# Patient Record
Sex: Female | Born: 1960 | Race: White | Hispanic: No | Marital: Married | Smoking: Former smoker
Health system: Southern US, Community
[De-identification: ages and names within clinical notes are randomized; demographics above are authoritative.]

## PROBLEM LIST (undated history)

## (undated) DIAGNOSIS — N200 Calculus of kidney: Secondary | ICD-10-CM

## (undated) DIAGNOSIS — R030 Elevated blood-pressure reading, without diagnosis of hypertension: Secondary | ICD-10-CM

## (undated) DIAGNOSIS — F32A Depression, unspecified: Secondary | ICD-10-CM

## (undated) DIAGNOSIS — Z87442 Personal history of urinary calculi: Secondary | ICD-10-CM

## (undated) DIAGNOSIS — K219 Gastro-esophageal reflux disease without esophagitis: Secondary | ICD-10-CM

## (undated) DIAGNOSIS — Z973 Presence of spectacles and contact lenses: Secondary | ICD-10-CM

## (undated) DIAGNOSIS — N201 Calculus of ureter: Secondary | ICD-10-CM

## (undated) DIAGNOSIS — F329 Major depressive disorder, single episode, unspecified: Secondary | ICD-10-CM

## (undated) HISTORY — PX: LUMBAR SPINE SURGERY: SHX701

---

## 1988-07-15 HISTORY — PX: ABDOMINAL HYSTERECTOMY: SHX81

## 2005-07-15 HISTORY — PX: OTHER SURGICAL HISTORY: SHX169

## 2007-07-16 HISTORY — PX: WRIST GANGLION EXCISION: SUR520

## 2008-08-28 ENCOUNTER — Emergency Department (HOSPITAL_BASED_OUTPATIENT_CLINIC_OR_DEPARTMENT_OTHER): Admission: EM | Admit: 2008-08-28 | Discharge: 2008-08-28 | Payer: Self-pay | Admitting: Emergency Medicine

## 2008-08-28 ENCOUNTER — Ambulatory Visit: Payer: Self-pay | Admitting: Diagnostic Radiology

## 2008-09-06 ENCOUNTER — Ambulatory Visit (HOSPITAL_BASED_OUTPATIENT_CLINIC_OR_DEPARTMENT_OTHER): Admission: RE | Admit: 2008-09-06 | Discharge: 2008-09-06 | Payer: Self-pay | Admitting: Urology

## 2008-09-06 HISTORY — PX: OTHER SURGICAL HISTORY: SHX169

## 2008-10-17 ENCOUNTER — Ambulatory Visit (HOSPITAL_COMMUNITY): Admission: RE | Admit: 2008-10-17 | Discharge: 2008-10-17 | Payer: Self-pay | Admitting: Urology

## 2008-10-17 HISTORY — PX: OTHER SURGICAL HISTORY: SHX169

## 2010-10-30 LAB — DIFFERENTIAL
Basophils Absolute: 0.7 10*3/uL — ABNORMAL HIGH (ref 0.0–0.1)
Basophils Relative: 4 % — ABNORMAL HIGH (ref 0–1)
Neutro Abs: 13 10*3/uL — ABNORMAL HIGH (ref 1.7–7.7)
Neutrophils Relative %: 79 % — ABNORMAL HIGH (ref 43–77)

## 2010-10-30 LAB — URINALYSIS, ROUTINE W REFLEX MICROSCOPIC
Bilirubin Urine: NEGATIVE
Nitrite: POSITIVE — AB
Specific Gravity, Urine: 1.009 (ref 1.005–1.030)
Urobilinogen, UA: 0.2 mg/dL (ref 0.0–1.0)

## 2010-10-30 LAB — BASIC METABOLIC PANEL
CO2: 26 mEq/L (ref 19–32)
Calcium: 9.4 mg/dL (ref 8.4–10.5)
Creatinine, Ser: 1 mg/dL (ref 0.4–1.2)
Glucose, Bld: 100 mg/dL — ABNORMAL HIGH (ref 70–99)

## 2010-10-30 LAB — POCT HEMOGLOBIN-HEMACUE: Hemoglobin: 13.9 g/dL (ref 12.0–15.0)

## 2010-10-30 LAB — CBC
MCHC: 34.1 g/dL (ref 30.0–36.0)
Platelets: 242 10*3/uL (ref 150–400)
RDW: 12.3 % (ref 11.5–15.5)

## 2010-10-30 LAB — URINE CULTURE

## 2010-10-30 LAB — URINE MICROSCOPIC-ADD ON

## 2010-11-27 NOTE — Op Note (Signed)
Caroline [name], Caroline [name]                  ACCOUNT NO.:  [id number]   MEDICAL RECORD NO.:  [id number]          PATIENT TYPE:  AMB   LOCATION:  [facility]                         FACILITY:  [facility]   PHYSICIAN:  Excell Seltzer. Annabell Howells, M.D.    DATE OF BIRTH:  Feb 28, 1961   DATE OF PROCEDURE:  09/06/2008  DATE OF DISCHARGE:                               OPERATIVE REPORT   PROCEDURE:  Cystoscopy, left retrograde pyelogram with interpretation,  left ureteroscopic stone extraction with holmium laser tripsy, and  insertion of left double-J stent.   PREOPERATIVE DIAGNOSIS:  Multiple left ureteral stones.   POSTOPERATIVE DIAGNOSIS:  Multiple left ureteral stones.   SURGEON:  Excell Seltzer. Annabell Howells, M.D.   ANESTHESIA:  General.   SPECIMENS:  Stone fragments.   DRAINS:  6-French x 26-cm double-J stent.   COMPLICATIONS:  None.   INDICATIONS:  Caroline [name] is a 50 year old white female with multiple left mid  ureteral stones with obstruction and has elected ureteroscopy.   FINDINGS AND PROCEDURE:  The patient was taken to the operating room  where general anesthetic was induced.  She was given a B and O  suppository.  She was given Cipro.  She was placed in lithotomy  position.  Her perineum and genitalia were prepped with Betadine  solution.  She was draped in the usual sterile fashion.  Cystoscopy was  performed using the 22-French scope and 12 and 70-degree lenses.  Examination revealed a normal urethra.  The bladder wall had mild  trabeculation with changes consistent with chronic follicular cystitis.  No tumors or stones were noted.  Ureteral orifices were unremarkable.   The left ureteral orifice was cannulated with a 5-French open-end  catheter and contrast instilled.   Left retrograde pyelogram demonstrated a normal distal ureter, but in  the mid ureter there were three 5 to 6-mm stones stacked on top of one  another with some dilation proximal.   After completion of retrograde pyelography, a wire was placed to  the  kidney alongside the stones and a 12-French dilator introducer cannula  was passed to dilate the distal ureter.   The 6-French short ureteroscope was then inserted alongside the wire.  The most distal stone was visualized and was engaged with the 365 laser  fiber at 0.6 and 6.  The power was increased to 0.8 with a frequency of  8 to aid fragmentation, and the distal most stone was fragmented  readily.  The fragments were then removed with a nitinol basket.  I then  moved to the proximal 2 stones which were then fragmented with the  laser, and once again removed with a nitinol basket.  Once all  significant fragments were removed, the ureteroscope was backed out.  The bladder was evacuated free of some stone fragments.  The cystoscope  was reinserted over the wire and a 6-French 26-cm double-J stent without  a string was passed over the wire to the kidney under fluoroscopic  guidance.  The wire was removed, leaving a good coil in the kidney and a  good coil in the bladder.  Final fluoroscopy  revealed at most a single  small fragment alongside the proximal limb of the stent that was no  larger in diameter than the stent, and it was felt that it should pass  when the stent was removed.   The patient's bladder was drained.  She was taken down from lithotomy  position.  Her anesthetic was reversed.  She was moved to the recovery  room in stable condition.  There were no complications.      Excell Seltzer. Annabell Howells, M.D.  Electronically Signed     JJW/MEDQ  D:  09/06/2008  T:  09/07/2008  Job:  [id number]

## 2010-11-27 NOTE — Op Note (Signed)
Caroline [name], Caroline [name]                  ACCOUNT NO.:  [id number]   MEDICAL RECORD NO.:  [id number]          PATIENT TYPE:  AMB   LOCATION:  DAY                          FACILITY:  [facility]   PHYSICIAN:  Excell Seltzer. Annabell Howells, M.D.    DATE OF BIRTH:  April 10, 1961   DATE OF PROCEDURE:  10/17/2008  DATE OF DISCHARGE:                               OPERATIVE REPORT   PROCEDURES:  Cystoscopy, left retrograde pyelogram, left ureteroscopic  stone extraction, insertion of left double-J stent.   PREOPERATIVE DIAGNOSIS:  Left mid ureteral stone.   POSTOPERATIVE DIAGNOSIS:  Left mid ureteral stone.   SURGEON:  Dr. Bjorn Pippin.   ANESTHESIA:  General.   SPECIMEN:  Stone.   DRAIN:  A 6-French 26-cm double-J stent.   COMPLICATIONS:  None.   INDICATIONS:  Caroline [name] is a 50 year old white female with a history of  stones, who underwent removal of three left mid ureteral stones back in  February.  Since the procedure, she has had some recurrent issues with  pain.  A repeat CT scan today revealed a 5-mm stone in the mid ureter at  the location of the prior stones, but this appears to have been a stone  that was in the kidney prior to her previous treatment.  It was felt  that ureteroscopy was indicated because of the duration of her symptoms.   FINDINGS AND PROCEDURE:  The patient was taken to the operating room  where she received Cipro, a general anesthetic was induced.  She was  placed in lithotomy position.  Her perineum and genitalia were prepped  with Betadine solution.  She was draped in the usual sterile fashion.  A  timeout was performed.   The 6-French short ureteroscope was then passed per urethra.  The left  ureteral orifice was identified and was successfully cannulated and  advanced to the stone at the level of the iliacs.  The stone was engaged  in a [employer] and was quite friable and crumbled.  Eventually, I  was able to negotiate the stone in the basket in a sufficient  orientation for  removal.  With removal of the primary stone fragment,  additional bits of gravel and fragments flushed from the ureter.   At this point, the cystoscope was inserted and a guidewire was passed  through the kidney.  A 6-French flexible ureteroscope was passed over  the guidewire to the kidney and the guidewire was removed.  Contrast was  instilled per the ureteroscopy port.   The left retrograde pyelogram demonstrated blunting of the collecting  system.  Obvious filling defects were not identified, but on CT she did  have some stone in the lower pole of the kidney.   The ureteroscope was then passed into each of the caliceal complexes  without evidence of large residual stones.  A small amount of gravel was  noted that seemed to flush around easily.   At this point, the guidewire was reinserted to the kidney.  The  ureteroscope was removed under direct vision.  The cystoscope was  reinserted over the wire and  a 6-French 26-cm double-J stent with string  was passed without difficulty to the kidney.  The wire was removed  leaving a good coil in the kidney and a good coil in the bladder.  The  bladder was drained of urine and residual fragments and the stent string  was left exiting the urethra and was secured to the patient's inner  thigh.  Her anesthetic was reversed.  She was removed from the recovery  room in stable condition.  There were no complications.      Excell Seltzer. Annabell Howells, M.D.  Electronically Signed     JJW/MEDQ  D:  10/17/2008  T:  10/17/2008  Job:  [id number]

## 2014-01-03 ENCOUNTER — Other Ambulatory Visit: Payer: Self-pay | Admitting: Urology

## 2014-01-20 ENCOUNTER — Encounter (HOSPITAL_BASED_OUTPATIENT_CLINIC_OR_DEPARTMENT_OTHER): Payer: Self-pay | Admitting: *Deleted

## 2014-01-21 ENCOUNTER — Encounter (HOSPITAL_BASED_OUTPATIENT_CLINIC_OR_DEPARTMENT_OTHER): Payer: Self-pay | Admitting: *Deleted

## 2014-01-21 NOTE — Progress Notes (Signed)
NPO AFTER MN. ARRIVE AT [id number]. NEEDS ISTAT 8.

## 2014-01-26 ENCOUNTER — Encounter (HOSPITAL_BASED_OUTPATIENT_CLINIC_OR_DEPARTMENT_OTHER)
Admission: RE | Disposition: A | Discharge status: Home / Self Care | Payer: Self-pay | Source: Ambulatory Visit | Attending: Urology

## 2014-01-26 ENCOUNTER — Ambulatory Visit (HOSPITAL_BASED_OUTPATIENT_CLINIC_OR_DEPARTMENT_OTHER): Payer: BC Managed Care – PPO | Admitting: Anesthesiology

## 2014-01-26 ENCOUNTER — Encounter (HOSPITAL_BASED_OUTPATIENT_CLINIC_OR_DEPARTMENT_OTHER): Payer: BC Managed Care – PPO | Admitting: Anesthesiology

## 2014-01-26 ENCOUNTER — Ambulatory Visit (HOSPITAL_BASED_OUTPATIENT_CLINIC_OR_DEPARTMENT_OTHER)
Admission: RE | Admit: 2014-01-26 | Discharge: 2014-01-26 | Disposition: A | Discharge status: Home / Self Care | Payer: BC Managed Care – PPO | Source: Ambulatory Visit | Attending: Urology | Admitting: Urology

## 2014-01-26 ENCOUNTER — Encounter (HOSPITAL_BASED_OUTPATIENT_CLINIC_OR_DEPARTMENT_OTHER): Payer: Self-pay | Admitting: Anesthesiology

## 2014-01-26 DIAGNOSIS — F329 Major depressive disorder, single episode, unspecified: Secondary | ICD-10-CM | POA: Insufficient documentation

## 2014-01-26 DIAGNOSIS — N200 Calculus of kidney: Secondary | ICD-10-CM | POA: Insufficient documentation

## 2014-01-26 DIAGNOSIS — F3289 Other specified depressive episodes: Secondary | ICD-10-CM | POA: Insufficient documentation

## 2014-01-26 DIAGNOSIS — K219 Gastro-esophageal reflux disease without esophagitis: Secondary | ICD-10-CM | POA: Insufficient documentation

## 2014-01-26 DIAGNOSIS — Z87891 Personal history of nicotine dependence: Secondary | ICD-10-CM | POA: Insufficient documentation

## 2014-01-26 DIAGNOSIS — N201 Calculus of ureter: Secondary | ICD-10-CM | POA: Insufficient documentation

## 2014-01-26 HISTORY — DX: Elevated blood-pressure reading, without diagnosis of hypertension: R03.0

## 2014-01-26 HISTORY — DX: Major depressive disorder, single episode, unspecified: F32.9

## 2014-01-26 HISTORY — PX: CYSTOSCOPY WITH RETROGRADE PYELOGRAM, URETEROSCOPY AND STENT PLACEMENT: SHX5789

## 2014-01-26 HISTORY — DX: Depression, unspecified: F32.A

## 2014-01-26 HISTORY — DX: Gastro-esophageal reflux disease without esophagitis: K21.9

## 2014-01-26 HISTORY — DX: Presence of spectacles and contact lenses: Z97.3

## 2014-01-26 HISTORY — DX: Personal history of urinary calculi: Z87.442

## 2014-01-26 HISTORY — DX: Calculus of ureter: N20.1

## 2014-01-26 HISTORY — PX: HOLMIUM LASER APPLICATION: SHX5852

## 2014-01-26 HISTORY — DX: Calculus of kidney: N20.0

## 2014-01-26 LAB — POCT I-STAT, CHEM 8
BUN: 16 mg/dL (ref 6–23)
CALCIUM ION: 1.3 mmol/L — AB (ref 1.12–1.23)
CREATININE: 0.7 mg/dL (ref 0.50–1.10)
Chloride: 102 mEq/L (ref 96–112)
GLUCOSE: 102 mg/dL — AB (ref 70–99)
HCT: 42 % (ref 36.0–46.0)
HEMOGLOBIN: 14.3 g/dL (ref 12.0–15.0)
Potassium: 3.9 mEq/L (ref 3.7–5.3)
Sodium: 144 mEq/L (ref 137–147)
TCO2: 25 mmol/L (ref 0–100)

## 2014-01-26 SURGERY — CYSTOURETEROSCOPY, WITH RETROGRADE PYELOGRAM AND STENT INSERTION
Anesthesia: General | Site: Ureter | Laterality: Bilateral

## 2014-01-26 MED ORDER — HYDROMORPHONE HCL 2 MG PO TABS
ORAL_TABLET | ORAL | Status: AC
  Filled 2014-01-26: qty 1

## 2014-01-26 MED ORDER — LACTATED RINGERS IV SOLN
INTRAVENOUS | Status: DC
  Administered 2014-01-26: 09:00:00 via INTRAVENOUS
  Filled 2014-01-26: qty 1000

## 2014-01-26 MED ORDER — FENTANYL CITRATE 0.05 MG/ML IJ SOLN
INTRAMUSCULAR | Status: DC | PRN
  Administered 2014-01-26: 25 ug via INTRAVENOUS
  Administered 2014-01-26: 50 ug via INTRAVENOUS
  Administered 2014-01-26: 25 ug via INTRAVENOUS

## 2014-01-26 MED ORDER — FENTANYL CITRATE 0.05 MG/ML IJ SOLN
INTRAMUSCULAR | Status: AC
  Filled 2014-01-26: qty 2

## 2014-01-26 MED ORDER — SULFAMETHOXAZOLE-TMP DS 800-160 MG PO TABS: 1.0000 | ORAL_TABLET | Freq: Two times a day (BID) | ORAL | Status: DC

## 2014-01-26 MED ORDER — STERILE WATER FOR IRRIGATION IR SOLN
Status: DC | PRN
  Administered 2014-01-26: 1000 mL

## 2014-01-26 MED ORDER — FENTANYL CITRATE 0.05 MG/ML IJ SOLN
25.0000 ug | INTRAMUSCULAR | Status: DC | PRN
  Administered 2014-01-26: 50 ug via INTRAVENOUS
  Filled 2014-01-26: qty 1

## 2014-01-26 MED ORDER — GENTAMICIN IN SALINE 1.6-0.9 MG/ML-% IV SOLN
80.0000 mg | INTRAVENOUS | Status: DC
  Filled 2014-01-26: qty 50

## 2014-01-26 MED ORDER — IOHEXOL 350 MG/ML SOLN
INTRAVENOUS | Status: DC | PRN
  Administered 2014-01-26: 30 mL via URETHRAL

## 2014-01-26 MED ORDER — ACETAMINOPHEN 10 MG/ML IV SOLN
INTRAVENOUS | Status: DC | PRN
  Administered 2014-01-26: 1000 mg via INTRAVENOUS

## 2014-01-26 MED ORDER — LACTATED RINGERS IV SOLN
INTRAVENOUS | Status: DC
  Filled 2014-01-26: qty 1000

## 2014-01-26 MED ORDER — SODIUM CHLORIDE 0.9 % IR SOLN
Status: DC | PRN
  Administered 2014-01-26: 6000 mL

## 2014-01-26 MED ORDER — LACTATED RINGERS IV SOLN
INTRAVENOUS | Status: DC | PRN
  Administered 2014-01-26 (×2): via INTRAVENOUS

## 2014-01-26 MED ORDER — PROPOFOL 10 MG/ML IV BOLUS
INTRAVENOUS | Status: DC | PRN
  Administered 2014-01-26: 150 mg via INTRAVENOUS
  Administered 2014-01-26: 50 mg via INTRAVENOUS

## 2014-01-26 MED ORDER — HYDROMORPHONE HCL 2 MG PO TABS
2.0000 mg | ORAL_TABLET | Freq: Four times a day (QID) | ORAL | Status: DC | PRN
  Administered 2014-01-26: 2 mg via ORAL
  Filled 2014-01-26: qty 1

## 2014-01-26 MED ORDER — DEXAMETHASONE SODIUM PHOSPHATE 4 MG/ML IJ SOLN
INTRAMUSCULAR | Status: DC | PRN
  Administered 2014-01-26: 10 mg via INTRAVENOUS

## 2014-01-26 MED ORDER — SENNOSIDES-DOCUSATE SODIUM 8.6-50 MG PO TABS: 1.0000 | ORAL_TABLET | Freq: Two times a day (BID) | ORAL | Status: DC

## 2014-01-26 MED ORDER — ONDANSETRON HCL 4 MG/2ML IJ SOLN
INTRAMUSCULAR | Status: DC | PRN
  Administered 2014-01-26: 4 mg via INTRAVENOUS

## 2014-01-26 MED ORDER — OXYBUTYNIN CHLORIDE 5 MG PO TABS: 5.0000 mg | ORAL_TABLET | Freq: Three times a day (TID) | ORAL | Status: DC | PRN

## 2014-01-26 MED ORDER — HYDROMORPHONE HCL 2 MG PO TABS: 2.0000 mg | ORAL_TABLET | ORAL | Status: AC | PRN

## 2014-01-26 MED ORDER — MIDAZOLAM HCL 5 MG/5ML IJ SOLN
INTRAMUSCULAR | Status: DC | PRN
  Administered 2014-01-26: 2 mg via INTRAVENOUS

## 2014-01-26 MED ORDER — MIDAZOLAM HCL 2 MG/2ML IJ SOLN
INTRAMUSCULAR | Status: AC
Start: 2014-01-26 — End: 2014-01-26
  Filled 2014-01-26: qty 2

## 2014-01-26 MED ORDER — OXYBUTYNIN CHLORIDE 5 MG PO TABS
5.0000 mg | ORAL_TABLET | Freq: Three times a day (TID) | ORAL | Status: AC
  Administered 2014-01-26: 5 mg via ORAL
  Filled 2014-01-26: qty 1

## 2014-01-26 MED ORDER — GENTAMICIN SULFATE 40 MG/ML IJ SOLN
5.0000 mg/kg | Freq: Once | INTRAVENOUS | Status: AC
  Administered 2014-01-26: 370 mg via INTRAVENOUS
  Filled 2014-01-26: qty 9.25

## 2014-01-26 MED ORDER — FENTANYL CITRATE 0.05 MG/ML IJ SOLN
INTRAMUSCULAR | Status: AC
  Filled 2014-01-26: qty 4

## 2014-01-26 MED ORDER — KETOROLAC TROMETHAMINE 30 MG/ML IJ SOLN
INTRAMUSCULAR | Status: DC | PRN
  Administered 2014-01-26: 30 mg via INTRAVENOUS

## 2014-01-26 MED ORDER — LIDOCAINE HCL (CARDIAC) 20 MG/ML IV SOLN
INTRAVENOUS | Status: DC | PRN
  Administered 2014-01-26: 80 mg via INTRAVENOUS

## 2014-01-26 SURGICAL SUPPLY — 40 items
BAG DRAIN URO-CYSTO SKYTR STRL (DRAIN) ×2 IMPLANT
BASKET LASER NITINOL 1.9FR (BASKET) ×2 IMPLANT
BASKET STNLS GEMINI 4WIRE 3FR (BASKET) IMPLANT
BASKET STONE 1.7 NGAGE (UROLOGICAL SUPPLIES) ×2 IMPLANT
BASKET ZERO TIP NITINOL 2.4FR (BASKET) IMPLANT
CANISTER SUCT LVC 12 LTR MEDI- (MISCELLANEOUS) ×2 IMPLANT
CATH INTERMIT  6FR 70CM (CATHETERS) ×2 IMPLANT
CATH URET 5FR 28IN CONE TIP (BALLOONS)
CATH URET 5FR 28IN OPEN ENDED (CATHETERS) IMPLANT
CATH URET 5FR 70CM CONE TIP (BALLOONS) IMPLANT
CLOTH BEACON ORANGE TIMEOUT ST (SAFETY) ×2 IMPLANT
DRAPE CAMERA CLOSED 9X96 (DRAPES) ×2 IMPLANT
ELECT REM PT RETURN 9FT ADLT (ELECTROSURGICAL)
ELECTRODE REM PT RTRN 9FT ADLT (ELECTROSURGICAL) IMPLANT
FIBER LASER FLEXIVA 200 (UROLOGICAL SUPPLIES) ×2 IMPLANT
FIBER LASER FLEXIVA 365 (UROLOGICAL SUPPLIES) IMPLANT
GLOVE BIO SURGEON STRL SZ7.5 (GLOVE) ×2 IMPLANT
GLOVE BIOGEL M 6.5 STRL (GLOVE) ×4 IMPLANT
GLOVE BIOGEL PI IND STRL 7.5 (GLOVE) ×1 IMPLANT
GLOVE BIOGEL PI INDICATOR 7.5 (GLOVE) ×1
GOWN PREVENTION PLUS LG XLONG (DISPOSABLE) IMPLANT
GOWN STRL REIN XL XLG (GOWN DISPOSABLE) IMPLANT
GOWN STRL REUS W/TWL LRG LVL3 (GOWN DISPOSABLE) ×2 IMPLANT
GOWN STRL REUS W/TWL XL LVL3 (GOWN DISPOSABLE) ×2 IMPLANT
GUIDEWIRE 0.038 PTFE COATED (WIRE) IMPLANT
GUIDEWIRE ANG ZIPWIRE 038X150 (WIRE) ×2 IMPLANT
GUIDEWIRE STR DUAL SENSOR (WIRE) ×2 IMPLANT
IV NS IRRIG 3000ML ARTHROMATIC (IV SOLUTION) ×4 IMPLANT
KIT BALLIN UROMAX 15FX10 (LABEL) IMPLANT
KIT BALLN UROMAX 15FX4 (MISCELLANEOUS) IMPLANT
KIT BALLN UROMAX 26 75X4 (MISCELLANEOUS)
PACK CYSTOSCOPY (CUSTOM PROCEDURE TRAY) ×2 IMPLANT
SET HIGH PRES BAL DIL (LABEL)
SHEATH ACCESS URETERAL 24CM (SHEATH) ×2 IMPLANT
SHEATH URET ACCESS 12FR/35CM (UROLOGICAL SUPPLIES) IMPLANT
SHEATH URET ACCESS 12FR/55CM (UROLOGICAL SUPPLIES) IMPLANT
STENT POLARIS 5FRX22 (STENTS) ×4 IMPLANT
SYRINGE 10CC LL (SYRINGE) ×2 IMPLANT
SYRINGE IRR TOOMEY STRL 70CC (SYRINGE) IMPLANT
TUBE FEEDING 8FR 16IN STR KANG (MISCELLANEOUS) ×2 IMPLANT

## 2014-01-26 NOTE — H&P (Signed)
Caroline [name] is an 53 y.o. female.    Chief Complaint: Pre-OP Bilateral Ureteroscopic Stone Manipulation  HPI:   1 - Recurrent Nephrolithiasis -  Pre 2015 - URS x 2 10/2012 - CT - Lt 6mm distal ureteral x 2 + Left intrarenal (about 1.3cm total scattered) + Rt intrarenal non-obstructing (8mm total) by CT. NO hydro. Given trial of medical therapy with no progression by KUB 11/2013 and again 12/2013..  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 No CV disease. No strong blood thinners.   Today Caroline [name] is seen to proceed with bilateral ureteroscpic stone manipulation with goal of stone free for her L>R nephrolithiasis.  No interval fevers. Most recent UA without infectious parameters.  Past Medical History  Diagnosis Date  . Left ureteral calculus   . Renal calculus, right   . History of kidney stones   . Depression   . GERD (gastroesophageal reflux disease)   . Borderline hypertension   . Wears glasses     Past Surgical History  Procedure Laterality Date  . Cysto/  left retrograde pyelogram/ left ureteroscopic laser lithotripsy stone extraction/  stent placement  09-06-2008  . Left ureteroscopic stone extraction/ stent placement  10-17-2008  . Abdominal hysterectomy  1990  . Lumbar spine surgery  x2  last one 2004  . Wrist ganglion excision Right 2009  . Removal tumor/ gland left neck  2007    History reviewed. No pertinent family history. Social History:  reports that she quit smoking about 2 years ago. Her smoking use included Cigarettes. She has a 30 pack-year smoking history. She has never used smokeless tobacco. She reports that she does not drink alcohol or use illicit drugs.  Allergies:  Allergies  Allergen Reactions  . Codeine Other (See Comments)    "feels sensation of blood moving thru body"    No  prescriptions prior to admission    No results found for this or any previous visit (from the past 48 hour(s)). No results found.  Review of Systems  Constitutional: Negative.  Negative for fever.  HENT: Negative.   Eyes: Negative.   Respiratory: Negative.   Cardiovascular: Negative.   Gastrointestinal: Negative.  Negative for nausea and vomiting.  Genitourinary: Positive for flank pain.  Musculoskeletal: Negative.   Skin: Negative.   Neurological: Negative.   Endo/Heme/Allergies: Negative.   Psychiatric/Behavioral: Negative.     Height 5\' 5"  (1.651 m), weight 76.204 kg (168 lb). Physical Exam  Constitutional: She is oriented to person, place, and time. She appears well-developed and well-nourished.  HENT:  Head: Normocephalic and atraumatic.  Eyes: Pupils are equal, round, and reactive to light.  Neck: Normal range of motion. Neck supple.  Cardiovascular: Normal rate.   Respiratory: Effort normal and breath sounds normal.  GI: Soft. Bowel sounds are normal.  Genitourinary:  Very mild Lt CVAT  Musculoskeletal: Normal range of motion.  Neurological: She is alert and oriented to person, place, and time.  Skin: Skin is warm and dry.  Psychiatric: She has a normal mood and affect. Her behavior is normal. Judgment and thought content normal.     Assessment/Plan  1 - Recurrent Nephrolithiasis - no interval progression with medical therapy. She wants bilateral ureeroscopic clean out.  We rediscussed ureteroscopic stone manipulation with basketing and laser-lithotripsy in detail.  We rediscussed risks including  bleeding, infection, damage to kidney / ureter  bladder, rarely loss of kidney. We rediscussed anesthetic risks and rare but serious surgical complications including DVT, PE, MI, and mortality. We specifically readdressed that in 5-10% of cases a staged approach is required with stenting followed by re-attempt ureteroscopy if anatomy unfavorable.   The patient voiced  understanding and wises to proceed today as planned.   Reinforced she will need bilateral stents at least temporarily since working on both renal units.   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   Ryleigh Buenger 01/26/2014, 6:09 AM

## 2014-01-26 NOTE — Discharge Instructions (Signed)
1 - You may have urinary urgency (bladder spasms) and bloody urine on / off with stent in place. This is normal. ° °2 - Call MD or go to ER for fever >102, severe pain / nausea / vomiting not relieved by medications, or acute change in medical status ° °Post Anesthesia Home Care Instructions ° °Activity: °Get plenty of rest for the remainder of the day. A responsible adult should stay with you for 24 hours following the procedure.  °For the next 24 hours, DO NOT: °-Drive a car °-Operate machinery °-Drink alcoholic beverages °-Take any medication unless instructed by your physician °-Make any legal decisions or sign important papers. ° °Meals: °Start with liquid foods such as gelatin or soup. Progress to regular foods as tolerated. Avoid greasy, spicy, heavy foods. If nausea and/or vomiting occur, drink only clear liquids until the nausea and/or vomiting subsides. Call your physician if vomiting continues. ° °Special Instructions/Symptoms: °Your throat may feel dry or sore from the anesthesia or the breathing tube placed in your throat during surgery. If this causes discomfort, gargle with warm salt water. The discomfort should disappear within 24 hours. °[facility] °[phone] °Post Ureteroscopy With or Without Stent Instructions ° °Definitions: ° °Ureter: The duct that transports urine from the kidney to the bladder. °Stent:   A plastic hollow tube that is placed into the ureter, from the kidney to the                 bladder to prevent the ureter from swelling shut. ° °GENERAL INSTRUCTIONS: ° °Despite the fact that no skin incisions were used, the area around the ureter and bladder is raw and irritated. The stent is a foreign body which will further irritate the bladder wall. This irritation is manifested by increased frequency of urination, both day and night, and by an increase in the urge to urinate. In some, the urge to urinate is present almost always. Sometimes the urge is strong enough  that you may not be able to stop yourself from urinating. The only real cure is to remove the stent and then give time for the bladder wall to heal which can't be done until the danger of the ureter swelling shut has passed, which varies. ° °You may see some blood in your urine while the stent is in place and a few days afterwards. Do not be alarmed, even if the urine was clear for a while. Get off your feet and drink lots of fluids until clearing occurs. If you start to pass clots or don't improve, call us. ° °DIET: °You may return to your normal diet immediately. Because of the raw surface of your bladder, alcohol, spicy foods, acid type foods and drinks with caffeine may cause irritation or frequency and should be used in moderation. To keep your urine flowing freely and to avoid constipation, drink plenty of fluids during the day ( 8-10 glasses ). °Tip: Avoid cranberry juice because it is very acidic. ° °ACTIVITY: °Your physical activity doesn't need to be restricted. However, if you are very active, you may see some blood in your urine. We suggest that you reduce your activity under these circumstances until the bleeding has stopped. ° °BOWELS: °It is important to keep your bowels regular during the postoperative period. Straining with bowel movements can cause bleeding. A bowel movement every other day is reasonable. Use a mild laxative if needed, such as Milk of Magnesia 2-3 tablespoons, or 2 Dulcolax tablets. Call if you continue to   have problems. If you have been taking narcotics for pain, before, during or after your surgery, you may be constipated. Take a laxative if necessary. ° ° °MEDICATION: °You should resume your pre-surgery medications unless told not to. In addition you will often be given an antibiotic to prevent infection. These should be taken as prescribed until the bottles are finished unless you are having an unusual reaction to one of the drugs. ° °PROBLEMS YOU SHOULD REPORT TO US: °· Fevers  over 100.5 Fahrenheit. °· Heavy bleeding, or clots ( See above notes about blood in urine ). °· Inability to urinate. °· Drug reactions ( hives, rash, nausea, vomiting, diarrhea ). °· Severe burning or pain with urination that is not improving. ° °FOLLOW-UP: °You will need a follow-up appointment to monitor your progress. Call for this appointment at the number listed above. Usually the first appointment will be about three to fourteen days after your surgery. ° ° ° ° ° °

## 2014-01-26 NOTE — Brief Op Note (Signed)
01/26/2014  11:26 AM  PATIENT:  Caroline [name]  53 y.o. female  PRE-OPERATIVE DIAGNOSIS:  LEFT URETERAL AND RIGHT RENAL STONES  POST-OPERATIVE DIAGNOSIS:  LEFT URETERAL AND RIGHT RENAL STONEs  PROCEDURE:  Procedure(s): CYSTOSCOPY WITH RETROGRADE PYELOGRAM, URETEROSCOPY AND STENT PLACEMENT (Bilateral) HOLMIUM LASER APPLICATION (Bilateral)  SURGEON:  Surgeon(s) and Role:    * Sebastian Acheheodore Jakiah Bienaime, MD - Primary  PHYSICIAN ASSISTANT:   ASSISTANTS: none   ANESTHESIA:   general  EBL:  Total I/O In: 700 [I.V.:700] Out: -   BLOOD ADMINISTERED:none  DRAINS: none   LOCAL MEDICATIONS USED:  NONE  SPECIMEN:  Source of Specimen:  Bilateral nephrolithiasis  DISPOSITION OF SPECIMEN:  [facility] for compositional analysis  COUNTS:  YES  TOURNIQUET:  * No tourniquets in log *  DICTATION: .Other Dictation: Dictation Number [id number]  PLAN OF CARE: Discharge to home after PACU  PATIENT DISPOSITION:  PACU - hemodynamically stable.   Delay start of Pharmacological VTE agent (>24hrs) due to surgical blood loss or risk of bleeding: not applicable

## 2014-01-26 NOTE — Anesthesia Postprocedure Evaluation (Signed)
  Anesthesia Post-op Note  Patient: Caroline HoyerDawn [name]  Procedure(s) Performed: Procedure(s) (LRB): CYSTOSCOPY WITH RETROGRADE PYELOGRAM, URETEROSCOPY AND STENT PLACEMENT (Bilateral) HOLMIUM LASER APPLICATION (Bilateral)  Patient Location: PACU  Anesthesia Type: General  Level of Consciousness: awake and alert   Airway and Oxygen Therapy: Patient Spontanous Breathing  Post-op Pain: mild  Post-op Assessment: Post-op Vital signs reviewed, Patient's Cardiovascular Status Stable, Respiratory Function Stable, Patent Airway and No signs of Nausea or vomiting  Last Vitals:  Filed Vitals:   01/26/14 1153  BP:   Pulse: 71  Temp:   Resp: 19    Post-op Vital Signs: stable   Complications: No apparent anesthesia complications

## 2014-01-26 NOTE — OR Nursing (Signed)
Bilateral stone taking by Dr. Berneice HeinrichManny

## 2014-01-26 NOTE — Anesthesia Procedure Notes (Signed)
Procedure Name: LMA Insertion Date/Time: 01/26/2014 9:39 AM Performed by: Tyrone NineSAUVE, Brion Sossamon F Pre-anesthesia Checklist: Patient identified, Timeout performed, Emergency Drugs available, Suction available and Patient being monitored Patient Re-evaluated:Patient Re-evaluated prior to inductionOxygen Delivery Method: Circle system utilized Preoxygenation: Pre-oxygenation with 100% oxygen Intubation Type: IV induction Ventilation: Mask ventilation without difficulty LMA: LMA inserted LMA Size: 4.0 Number of attempts: 1 Airway Equipment and Method: Bite block Placement Confirmation: positive ETCO2 and breath sounds checked- equal and bilateral Tube secured with: Tape Dental Injury: Teeth and Oropharynx as per pre-operative assessment

## 2014-01-26 NOTE — Transfer of Care (Addendum)
Immediate Anesthesia Transfer of Care Note  Patient: [name]  Procedure(s) Performed: Procedure(s) (LRB): CYSTOSCOPY WITH RETROGRADE PYELOGRAM, URETEROSCOPY AND STENT PLACEMENT (Bilateral) HOLMIUM LASER APPLICATION (Bilateral)  Patient Location: PACU  Anesthesia Type: General  Level of Consciousness: awake, alert  and oriented  Airway & Oxygen Therapy: Patient Spontanous Breathing and Patient connected to nasal cannula oxygen  Post-op Assessment: Report given to PACU RN and Post -op Vital signs reviewed and stable  Post vital signs: Reviewed and stable  Complications: No apparent anesthesia complications

## 2014-01-26 NOTE — Anesthesia Preprocedure Evaluation (Addendum)
Anesthesia Evaluation  Patient identified by MRN, date of birth, ID band Patient awake    Reviewed: Allergy & Precautions, H&P , NPO status , Patient's Chart, lab work & pertinent test results  Airway Mallampati: II TM Distance: >3 FB Neck ROM: full    Dental no notable dental hx. (+) Teeth Intact, Dental Advisory Given, Partial Lower   Pulmonary neg pulmonary ROS, former smoker,  30 py former smoker breath sounds clear to auscultation  Pulmonary exam normal       Cardiovascular Exercise Tolerance: Good negative cardio ROS  Rhythm:regular Rate:Normal  Borderline htn   Neuro/Psych Depression Panic disordernegative neurological ROS  negative psych ROS   GI/Hepatic negative GI ROS, Neg liver ROS, GERD-  Medicated and Controlled,  Endo/Other  negative endocrine ROS  Renal/GU Renal diseasenegative Renal ROSRenal calculus  negative genitourinary   Musculoskeletal   Abdominal   Peds  Hematology negative hematology ROS (+)   Anesthesia Other Findings Partial plate is @ home.  Reproductive/Obstetrics negative OB ROS                      Anesthesia Physical Anesthesia Plan  ASA: II  Anesthesia Plan: General   Post-op Pain Management:    Induction: Intravenous  Airway Management Planned: LMA  Additional Equipment:   Intra-op Plan:   Post-operative Plan:   Informed Consent: I have reviewed the patients History and Physical, chart, labs and discussed the procedure including the risks, benefits and alternatives for the proposed anesthesia with the patient or authorized representative who has indicated his/her understanding and acceptance.   Dental Advisory Given  Plan Discussed with: CRNA and Surgeon  Anesthesia Plan Comments:         Anesthesia Quick Evaluation

## 2014-01-27 ENCOUNTER — Encounter (HOSPITAL_BASED_OUTPATIENT_CLINIC_OR_DEPARTMENT_OTHER): Payer: Self-pay | Admitting: Urology

## 2014-01-28 NOTE — Op Note (Signed)
NAMAmaryllis [name]:  [name], DON                   ACCOUNT NO.:  [id number]  MEDICAL RECORD NO.:  [id number]  LOCATION:                                 FACILITY:  PHYSICIAN:  Sebastian Acheheodore Deandria Klute, MD     DATE OF BIRTH:  07/03/1961  DATE OF PROCEDURE:  01/26/2014 DATE OF DISCHARGE:  01/26/2014                              OPERATIVE REPORT   DIAGNOSIS:  Left ureteral and bilateral renal stones.  PROCEDURE: 1. Cystoscopy with bilateral retrograde pyelogram interpretation. 2. Bilateral ureteroscopy with laser lithotripsy. 3. Insertion of bilateral ureteral stents 5 x 22 Polaris, no tether.  ESTIMATED BLOOD LOSS:  Nil.  COMPLICATIONS:  None.  SPECIMEN:  Left ureteral and bilateral renal stones for compositional analysis.  FINDINGS: 1. Left retrograde pyelogram with distal filling defect consistent     with known stone. 2. Unremarkable right retrograde pyelogram. 3. Left ureteral and bilateral renal stones as expected. 4. Excellent deployment of bilateral stents proximal curl, renal     pelvis distal, and urinary bladder.  INDICATION:  Ms. Caroline [name] is a pleasant 53 year old lady with a history recurrent nephrolithiasis.  She has had known left distal ureteral stones for several months and is on a trial of medical therapy, however, she has failed to pass her stones and fragments had been persistent on serial imaging.  Options were discussed for management including continued medical therapy versus shockwave lithotripsy versus ureteroscopy with unilateral versus bilateral treatment, and she wished to proceed with bilateral ureteroscopic stone manipulation for goal of stone free.  Informed consent was obtained and placed in medical record.  PROCEDURE IN DETAIL:  The patient being Caroline [name] verified, procedure being bilateral ureteroscopic stone manipulation was confirmed. Procedure was carried out.  Time-out was performed.  Intravenous antibiotics were administered.  General LMA anesthesia was  introduced. Patient placed into a low lithotomy position.  Sterile field was created by prepping and draping the patient's vagina, introitus, and proximal thighs using iodine x3.  Next, cystourethroscopy was performed using a 22-French rigid cystoscope with 12-degree offset lens.  Inspection of the urinary bladder revealed no diverticula, calcifications, papular lesions.  The left ureteral orifice was cannulated with a 6-French end- hole catheter and left retrograde pyelogram was obtained.  Left retrograde pyelogram demonstrated single left ureter with single system left kidney.  There were 2 filling defects in distal ureter consistent with known stone.  A 0.038, angle-tipped ZIPwire was advanced at the level of the upper pole and set aside as a safety wire.  Next, semi-rigid ureteroscopy was performed of the distal left ureter using a 6-French semi-rigid ureteroscope alongside a separate Sensor working wire with the feeding tube in urinary bladder for pressure release.  As expected, 2 distal stones were encountered.  These appeared to be too large for simple basketing.  As such Holmium laser energy was applied to the stone using a 200 nanometer fiber with settings of 0.5 joules and 5 hertz fragmenting the stone approximately 3-4 pieces.  These were then grasped with an escape basket and brought out in their entirety.  Semi- rigid ureteroscopy, the remaining 2/3 of the proximal ureter revealed no additional stone fragments and no mucosal  abnormalities.  As the goal was stone free on this side, and it was felt that there were likely renal pelvis stones, the semi-rigid ureteroscope was exchanged for a 12/14 ureteral access sheath over the sensor working wire to the level of the proximal ureter using continuous fluoroscopic guidance.  Next, flexible digital ureteroscopy performed using a 6-French tip flexible digital ureteroscope.  Inspection of each calix x2 revealed multifocal renal  stones, most predominantly in the lower pole.  Most of these were amenable to simple basketing and were removed with an escape basket or an engage basket respectively.  In the lower pole, there was a stone within a calyx.  It had relatively narrow infundibulum and Holmium laser energy was applied to the stone, which resulted in fragmentation into many smaller pieces.  The dominant fragment, these were grasped and brought out in their entirety.  There were multiple small pieces still left each of which of these were approximately 1 mm or less in diameter, but conglomerate was felt to be proximal 5 to 6 mm.  As such, a blood mop technique was used in which 5 mL the patient's autologous blood was collected by Anesthesia personnel and injected via the ureteroscope into lower pole calyx, allowed to sit for 5 minutes and then sequentially removed.  Thus removing approximately 75% of this residual stone in the lower pole.  Following these maneuvers, repeat panendoscopic examination of the left kidney revealed complete resolution of all stone fragments larger than 1/3 mm, no evidence of perforation.  The sheath was removed under continuous fluoroscopic vision and no mucosal abnormalities were found.  Finally, a new 5 x 22 Polaris-type stent was placed over the remaining safety wire using fluoroscopic guidance.  Good proximal and distal deployment were noted.  Attention was then directed at the right side.  The right ureteral orifice was cannulated with a 6-French catheter and right retrograde pyelogram obtained.  Right retrograde pyelogram demonstrated a single right ureter, single system right kidney.  No filling defects or narrowing noted.  A 0.038 ZIPwire was advanced at the level of the upper pole and set aside as a safety wire.  Next, semi-rigid ureteroscopy was performed of the distal two-thirds of the right ureter alongside a separate Sensor working wire. No mucosal abnormalities were  found.  The semi-rigid ureteroscope was then exchanged for a 12/14 ureteral access sheath at the level of proximal ureter using continuous fluoroscopic vision.  Next, flexible digital ureteroscopy performed on the right side using 6-French flexible digital ureteroscope.  Panendoscopic examination of each calix x2 on the right side revealed multifocal stones in all aspects of the kidney, most commonly in the lower pole.  Most of these were amenable to simple basketing and an engage basket was used to remove each fragment, set aside for compositional analysis.  In the lower pole, there was a very large dominant submucosal stone, it was larger than the others.  As such, Holmium laser energy was used to unroof, the stone fragmented to approximately 3-4 smaller pieces which were then removed in their entirety.  Repeat panendoscopic examination of left kidney, revealed complete resolution of all stone fragments larger than 1/3 mm, no evidence of perforation.  Hemostasis appeared excellent.  The sheath was removed under continuous ureteroscopic vision.  No mucosal abnormalities were found.  Finally, a new 5 x 22 Polaris-type stent was placed with remaining safety wire and good proximal and distal deployment were noted.  The bladder was emptied per cystoscope and several small fragments  had previously been in the upper tract were irrigated and set aside for compositional analysis.  Procedure was then terminated.  The patient tolerated the procedure well.  There were no immediate periprocedural complications.  The patient was taken to postanesthesia care unit in stable condition.         ______________________________ Sebastian Ache, MD    TM/MEDQ  D:  01/26/2014  T:  01/26/2014  Job:  [id number]

## 2014-05-30 ENCOUNTER — Encounter (HOSPITAL_BASED_OUTPATIENT_CLINIC_OR_DEPARTMENT_OTHER): Payer: Self-pay

## 2014-05-30 ENCOUNTER — Emergency Department (HOSPITAL_BASED_OUTPATIENT_CLINIC_OR_DEPARTMENT_OTHER)
Admission: EM | Admit: 2014-05-30 | Discharge: 2014-05-30 | Disposition: A | Discharge status: Home / Self Care | Payer: BC Managed Care – PPO | Attending: Emergency Medicine | Admitting: Emergency Medicine

## 2014-05-30 ENCOUNTER — Emergency Department (HOSPITAL_BASED_OUTPATIENT_CLINIC_OR_DEPARTMENT_OTHER): Payer: BC Managed Care – PPO

## 2014-05-30 DIAGNOSIS — Z973 Presence of spectacles and contact lenses: Secondary | ICD-10-CM | POA: Insufficient documentation

## 2014-05-30 DIAGNOSIS — R1032 Left lower quadrant pain: Secondary | ICD-10-CM | POA: Insufficient documentation

## 2014-05-30 DIAGNOSIS — Z79899 Other long term (current) drug therapy: Secondary | ICD-10-CM | POA: Insufficient documentation

## 2014-05-30 DIAGNOSIS — Z8719 Personal history of other diseases of the digestive system: Secondary | ICD-10-CM | POA: Diagnosis not present

## 2014-05-30 DIAGNOSIS — R1031 Right lower quadrant pain: Secondary | ICD-10-CM | POA: Diagnosis not present

## 2014-05-30 DIAGNOSIS — Z87442 Personal history of urinary calculi: Secondary | ICD-10-CM | POA: Insufficient documentation

## 2014-05-30 DIAGNOSIS — M549 Dorsalgia, unspecified: Secondary | ICD-10-CM | POA: Diagnosis present

## 2014-05-30 DIAGNOSIS — F329 Major depressive disorder, single episode, unspecified: Secondary | ICD-10-CM | POA: Diagnosis not present

## 2014-05-30 DIAGNOSIS — Z9889 Other specified postprocedural states: Secondary | ICD-10-CM | POA: Insufficient documentation

## 2014-05-30 DIAGNOSIS — R109 Unspecified abdominal pain: Secondary | ICD-10-CM

## 2014-05-30 LAB — URINALYSIS, ROUTINE W REFLEX MICROSCOPIC
Bilirubin Urine: NEGATIVE
Glucose, UA: NEGATIVE mg/dL
Hgb urine dipstick: NEGATIVE
Ketones, ur: NEGATIVE mg/dL
Leukocytes, UA: NEGATIVE
NITRITE: NEGATIVE
PH: 7 (ref 5.0–8.0)
Protein, ur: NEGATIVE mg/dL
SPECIFIC GRAVITY, URINE: 1.012 (ref 1.005–1.030)
Urobilinogen, UA: 1 mg/dL (ref 0.0–1.0)

## 2014-05-30 LAB — CBC WITH DIFFERENTIAL/PLATELET
Basophils Absolute: 0 10*3/uL (ref 0.0–0.1)
Basophils Relative: 0 % (ref 0–1)
Eosinophils Absolute: 0.1 10*3/uL (ref 0.0–0.7)
Eosinophils Relative: 2 % (ref 0–5)
HCT: 42.1 % (ref 36.0–46.0)
Hemoglobin: 13.8 g/dL (ref 12.0–15.0)
Lymphocytes Relative: 37 % (ref 12–46)
Lymphs Abs: 3 10*3/uL (ref 0.7–4.0)
MCH: 28.9 pg (ref 26.0–34.0)
MCHC: 32.8 g/dL (ref 30.0–36.0)
MCV: 88.1 fL (ref 78.0–100.0)
Monocytes Absolute: 0.6 10*3/uL (ref 0.1–1.0)
Monocytes Relative: 7 % (ref 3–12)
Neutro Abs: 4.4 10*3/uL (ref 1.7–7.7)
Neutrophils Relative %: 54 % (ref 43–77)
Platelets: 249 10*3/uL (ref 150–400)
RBC: 4.78 MIL/uL (ref 3.87–5.11)
RDW: 13.8 % (ref 11.5–15.5)
WBC: 8.1 10*3/uL (ref 4.0–10.5)

## 2014-05-30 LAB — BASIC METABOLIC PANEL WITH GFR
Anion gap: 11 (ref 5–15)
BUN: 19 mg/dL (ref 6–23)
CO2: 29 meq/L (ref 19–32)
Calcium: 9.4 mg/dL (ref 8.4–10.5)
Chloride: 101 meq/L (ref 96–112)
Creatinine, Ser: 0.9 mg/dL (ref 0.50–1.10)
GFR calc Af Amer: 83 mL/min — ABNORMAL LOW
GFR calc non Af Amer: 71 mL/min — ABNORMAL LOW
Glucose, Bld: 103 mg/dL — ABNORMAL HIGH (ref 70–99)
Potassium: 3.5 meq/L — ABNORMAL LOW (ref 3.7–5.3)
Sodium: 141 meq/L (ref 137–147)

## 2014-05-30 MED ORDER — SODIUM CHLORIDE 0.9 % IV BOLUS (SEPSIS)
1000.0000 mL | Freq: Once | INTRAVENOUS | Status: AC
Start: 2014-05-30 — End: 2014-05-30
  Administered 2014-05-30: 1000 mL via INTRAVENOUS

## 2014-05-30 MED ORDER — KETOROLAC TROMETHAMINE 30 MG/ML IJ SOLN
30.0000 mg | Freq: Once | INTRAMUSCULAR | Status: AC
  Administered 2014-05-30: 30 mg via INTRAVENOUS
  Filled 2014-05-30: qty 1

## 2014-05-30 MED ORDER — HYDROCODONE-ACETAMINOPHEN 5-325 MG PO TABS: 1.0000 | ORAL_TABLET | Freq: Four times a day (QID) | ORAL | Status: AC | PRN

## 2014-05-30 NOTE — ED Notes (Signed)
MD at bedside. 

## 2014-05-30 NOTE — ED Notes (Signed)
C/o pain to entire back since 11/11-denies injury

## 2014-05-30 NOTE — Discharge Instructions (Signed)
Back Pain, Adult Low back pain is very common. About 1 in 5 people have back pain.The cause of low back pain is rarely dangerous. The pain often gets better over time.About half of people with a sudden onset of back pain feel better in just 2 weeks. About 8 in 10 people feel better by 6 weeks.  CAUSES Some common causes of back pain include:  Strain of the muscles or ligaments supporting the spine.  Wear and tear (degeneration) of the spinal discs.  Arthritis.  Direct injury to the back. DIAGNOSIS Most of the time, the direct cause of low back pain is not known.However, back pain can be treated effectively even when the exact cause of the pain is unknown.Answering your caregiver's questions about your overall health and symptoms is one of the most accurate ways to make sure the cause of your pain is not dangerous. If your caregiver needs more information, he or she may order lab work or imaging tests (X-rays or MRIs).However, even if imaging tests show changes in your back, this usually does not require surgery. HOME CARE INSTRUCTIONS For many people, back pain returns.Since low back pain is rarely dangerous, it is often a condition that people can learn to manageon their own.   Remain active. It is stressful on the back to sit or stand in one place. Do not sit, drive, or stand in one place for more than 30 minutes at a time. Take short walks on level surfaces as soon as pain allows.Try to increase the length of time you walk each day.  Do not stay in bed.Resting more than 1 or 2 days can delay your recovery.  Do not avoid exercise or work.Your body is made to move.It is not dangerous to be active, even though your back may hurt.Your back will likely heal faster if you return to being active before your pain is gone.  Pay attention to your body when you bend and lift. Many people have less discomfortwhen lifting if they bend their knees, keep the load close to their bodies,and  avoid twisting. Often, the most comfortable positions are those that put less stress on your recovering back.  Find a comfortable position to sleep. Use a firm mattress and lie on your side with your knees slightly bent. If you lie on your back, put a pillow under your knees.  Only take over-the-counter or prescription medicines as directed by your caregiver. Over-the-counter medicines to reduce pain and inflammation are often the most helpful.Your caregiver may prescribe muscle relaxant drugs.These medicines help dull your pain so you can more quickly return to your normal activities and healthy exercise.  Put ice on the injured area.  Put ice in a plastic bag.  Place a towel between your skin and the bag.  Leave the ice on for 15-20 minutes, 03-04 times a day for the first 2 to 3 days. After that, ice and heat may be alternated to reduce pain and spasms.  Ask your caregiver about trying back exercises and gentle massage. This may be of some benefit.  Avoid feeling anxious or stressed.Stress increases muscle tension and can worsen back pain.It is important to recognize when you are anxious or stressed and learn ways to manage it.Exercise is a great option. SEEK MEDICAL CARE IF:  You have pain that is not relieved with rest or medicine.  You have pain that does not improve in 1 week.  You have new symptoms.  You are generally not feeling well. SEEK   IMMEDIATE MEDICAL CARE IF:   You have pain that radiates from your back into your legs.  You develop new bowel or bladder control problems.  You have unusual weakness or numbness in your arms or legs.  You develop nausea or vomiting.  You develop abdominal pain.  You feel faint. Document Released: 07/01/2005 Document Revised: 12/31/2011 Document Reviewed: 11/02/2013 Exit[employer] Patient Information 2015 [employer]. This information is not intended to replace advice given to you by your health care provider. Make sure you  discuss any questions you have with your health care provider.  

## 2014-05-30 NOTE — ED Provider Notes (Signed)
CSN: [id number]     Arrival date & time 05/30/14  1659 History  This chart was scribed for Toy CookeyMegan Nishka Heide, MD by Charline BillsEssence Howell, ED Scribe. The patient was seen in room MH11/MH11. Patient's care was started at 6:54 PM.   Chief Complaint  Patient presents with  . Back Pain   Patient is a 53 y.o. female presenting with back pain. The history is provided by the patient. No language interpreter was used.  Back Pain Location:  Generalized Quality:  Burning Pain severity:  Moderate Onset quality:  Sudden Duration:  5 days Timing:  Constant Progression:  Worsening Relieved by:  Lying down Worsened by:  Movement Associated symptoms: abdominal pain   Associated symptoms: no chest pain, no dysuria, no fever, no headaches, no numbness, no pelvic pain and no weakness    HPI Comments: Caroline [name] is a 53 y.o. female who presents to the Emergency Department complaining of constant, gradually worsening L-sided back pain onset 5 days ago. Pt describes the pain as a burning sensation that radiates into lower abdomen. Pain is exacerbated with certain movements and lying on her back. Pain is similar to previous kidney pain. She denies nausea, vomiting, diarrhea, dysuria, vaginal bleeding, vaginal discharge, urinary or bowel incontinence, fever, chills, numbness, weakness. Pt has tried stool softeners without relief. No recent heavy lifting. Pt reports h/o kidney stone surgery; last surgery in July 2015. She also reports h/o lumbar surgeries over 10 years ago.   [facility]rologist: [facility]   Past Medical History  Diagnosis Date  . Left ureteral calculus   . Renal calculus, right   . History of kidney stones   . Depression   . GERD (gastroesophageal reflux disease)   . Borderline hypertension   . Wears glasses    Past Surgical History  Procedure Laterality Date  . Cysto/  left retrograde pyelogram/ left ureteroscopic laser lithotripsy stone extraction/  stent placement  09-06-2008  . Left ureteroscopic  stone extraction/ stent placement  10-17-2008  . Abdominal hysterectomy  1990  . Lumbar spine surgery  x2  last one 2004  . Wrist ganglion excision Right 2009  . Removal tumor/ gland left neck  2007  . Cystoscopy with retrograde pyelogram, ureteroscopy and stent placement Bilateral 01/26/2014    Procedure: CYSTOSCOPY WITH RETROGRADE PYELOGRAM, URETEROSCOPY AND STENT PLACEMENT;  Surgeon: Sebastian Acheheodore Manny, MD;  [facility];  Service: Urology;  Laterality: Bilateral;  . Holmium laser application Bilateral 01/26/2014    Procedure: HOLMIUM LASER APPLICATION;  Surgeon: Sebastian Acheheodore Manny, MD;  [facility];  Service: Urology;  Laterality: Bilateral;   No family history on file. History  Substance Use Topics  . Smoking status: Former Smoker -- 0.00 packs/day for 30 years    Quit date: 01/22/2012  . Smokeless tobacco: Never Used     Comment: CURRENTLY USING E-CIG  . Alcohol Use: No   OB History    No data available     Review of Systems  Constitutional: Negative for fever, chills, diaphoresis, activity change, appetite change and fatigue.  HENT: Negative for congestion, facial swelling, rhinorrhea and sore throat.   Eyes: Negative for photophobia and discharge.  Respiratory: Negative for cough, chest tightness and shortness of breath.   Cardiovascular: Negative for chest pain, palpitations and leg swelling.  Gastrointestinal: Positive for abdominal pain. Negative for nausea, vomiting and diarrhea.  Endocrine: Negative for polydipsia and polyuria.  Genitourinary: Negative for dysuria, frequency, vaginal bleeding, vaginal discharge, difficulty urinating and pelvic pain.  Musculoskeletal: Positive for back pain. Negative for arthralgias, neck pain and neck stiffness.  Skin: Negative for color change and wound.  Allergic/Immunologic: Negative for immunocompromised state.  Neurological: Negative for facial asymmetry, weakness, numbness and headaches.   Hematological: Does not bruise/bleed easily.  Psychiatric/Behavioral: Negative for confusion and agitation.   Allergies  Codeine and Percocet  Home Medications   Prior to Admission medications   Medication Sig Start Date End Date Taking? Authorizing Provider  triamterene-hydrochlorothiazide (DYAZIDE) 37.5-25 MG per capsule Take 1 capsule by mouth daily.   Yes Historical Provider, MD  doxepin (SINEQUAN) 10 MG capsule Take 30 mg by mouth at bedtime.    Historical Provider, MD  HYDROmorphone (DILAUDID) 2 MG tablet Take 1 tablet (2 mg total) by mouth every 4 (four) hours as needed for moderate pain or severe pain. Post-operatively 01/26/14   Sebastian Acheheodore Manny, MD   Triage Vitals: BP 156/88 mmHg  Pulse 79  Temp(Src) 98 F (36.7 C)  Resp 16  Ht 5\' 5"  (1.651 m)  Wt 160 lb (72.576 kg)  BMI 26.63 kg/m2  SpO2 100% Physical Exam  Constitutional: She is oriented to person, place, and time. She appears well-developed and well-nourished. No distress.  HENT:  Head: Normocephalic.  Mouth/Throat: Oropharynx is clear and moist.  Eyes: Pupils are equal, round, and reactive to light.  Neck: Neck supple.  Cardiovascular: Normal rate, regular rhythm and normal heart sounds.   Pulmonary/Chest: Effort normal and breath sounds normal. No respiratory distress. She has no wheezes.  Abdominal: Soft. She exhibits no distension. There is tenderness in the right lower quadrant and left lower quadrant. There is CVA tenderness. There is no rebound and no guarding.  L CVA tenderness Mild bilateral lower quadrant tenderness without rebound or guarding  Musculoskeletal: She exhibits no edema or tenderness.  No lower extremity numbness or weakness Negative straight leg raise  Neurological: She is alert and oriented to person, place, and time.  Skin: Skin is warm and dry.  Psychiatric: She has a normal mood and affect.  Nursing note and vitals reviewed.  ED Course  Procedures (including critical care  time) DIAGNOSTIC STUDIES: Oxygen Saturation is 100% on RA, normal by my interpretation.    COORDINATION OF CARE: 7:03 PM-Discussed treatment plan which includes CT and medication for pain with pt at bedside and pt agreed to plan.   Labs Review Labs Reviewed  BASIC METABOLIC PANEL - Abnormal; Notable for the following:    Potassium 3.5 (*)    Glucose, Bld 103 (*)    GFR calc non Af Amer 71 (*)    GFR calc Af Amer 83 (*)    All other components within normal limits  URINALYSIS, ROUTINE W REFLEX MICROSCOPIC  CBC WITH DIFFERENTIAL   Imaging Review Ct Renal Stone Study  05/30/2014   CLINICAL DATA:  LEFT flank pain for 5 days, history of kidney stones and low back surgery  EXAM: CT ABDOMEN AND PELVIS WITHOUT CONTRAST  TECHNIQUE: Multidetector CT imaging of the abdomen and pelvis was performed following the standard protocol without IV contrast. Sagittal and coronal MPR images reconstructed from axial data set. Oral contrast not administered for this indication.  COMPARISON:  10/26/2013  FINDINGS: Lung bases clear.  Multiple small BILATERAL nonobstructing renal calculi.  No hydronephrosis, ureteral dilatation or ureteral calcification.  Unremarkable bladder.  Liver, spleen, pancreas, and adrenal glands unremarkable for technique.  Tiny umbilical hernia containing fat.  Uterus surgically absent with normal sized ovaries and nonvisualization of appendix.  Stomach and  bowel loops unremarkable for technique.  Scattered atherosclerotic calcifications.  No mass, adenopathy, free fluid, free air, or acute osseous findings.  IMPRESSION: BILATERAL small nonobstructing renal calculi.  Tiny umbilical hernia containing fat.  No acute intra-abdominal or intrapelvic abnormalities.   Electronically Signed   By: Ulyses Southward M.D.   On: 05/30/2014 19:29    EKG Interpretation None      MDM   Final diagnoses:  None    Pt is a 53 y.o. female with Pmhx as above who presents with about 6 days of generalized back  pain which is now worse in her left flank with some radiation to the lower abdomen. Pain is worse with movement. She's had no associated GU symptoms, no fever, chills, nausea vomiting or diarrhea. She's had no numbness, weakness, bowel or bladder incontinence. On physical exam vital signs are stable and she is in no acute distress. She has left sided CVA tenderness. Neuro exam is unremarkable. Given her history of recurrent ureteral stones requiring manipulation, CT stone study was ordered and was normal. UA was also normal.CBC and BMP are grossly unremarkable except for mild hypokalemia of 3.5. She felt somewhat better after IV Toradol. We'll discharge home with Norco for pain. I suspect that she is having musculoskeletal back pain. Have recommended she follow up closely with her PCP office and return for new or worsening symptoms.      I personally performed the services described in this documentation, which was scribed in my presence. The recorded information has been reviewed and is accurate.    Toy Cookey, MD 05/30/14 [phone]

## 2014-05-30 NOTE — ED Notes (Signed)
Patient transported to CT via stretcher per tech. 

## 2014-05-30 NOTE — ED Notes (Signed)
MD at bedside to discuss results of testing. 

## 2015-09-01 IMAGING — CT CT RENAL STONE PROTOCOL
2 of 4 series · 17 of 46 positions shown, 19 images · non-contrast
Comparison: 10/26/2013

CLINICAL DATA: LEFT flank pain for 5 days, history of kidney stones
and low back surgery

EXAM:
CT ABDOMEN AND PELVIS WITHOUT CONTRAST
TECHNIQUE: Multidetector CT imaging of the abdomen and pelvis was performed
following the standard protocol without IV contrast. Sagittal and
coronal MPR images reconstructed from axial data set. Oral contrast
not administered for this indication.

[Series 2: renal stone > 200 lbs 5.0 b31f · axial · 0.79mm/px · z∈[-471,-66]mm · 14 of 89 slices shown, 16 images]
[im 4/89  soft-tissue]
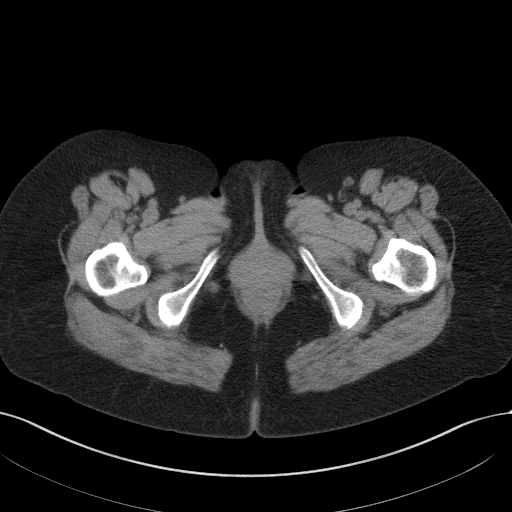
[im 4/89  bone]
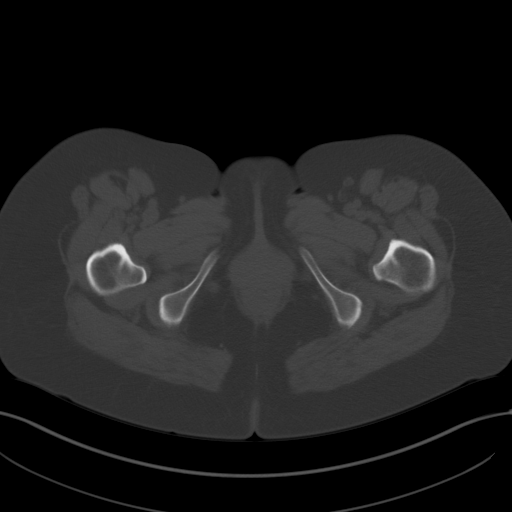
[im 11/89  soft-tissue]
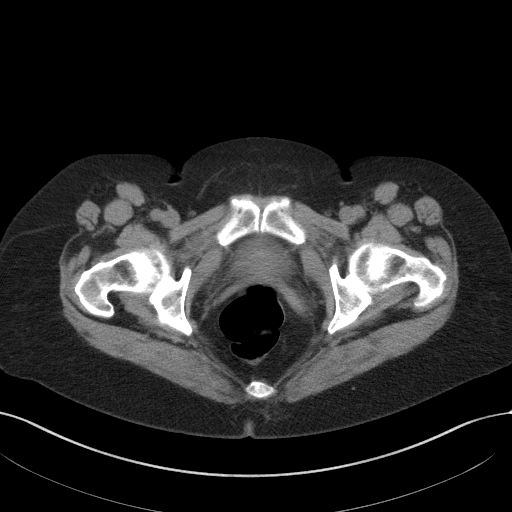
[im 18/89  soft-tissue]
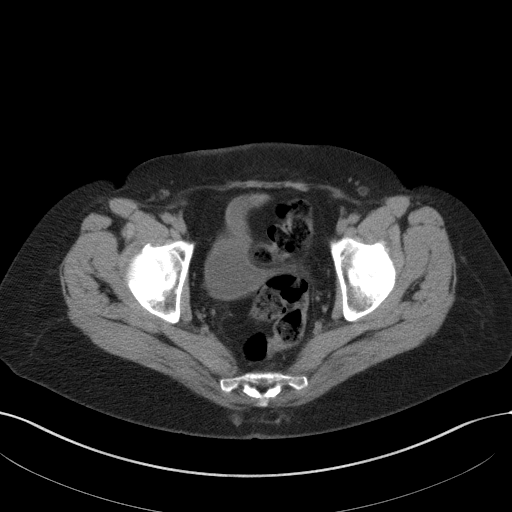
[im 25/89  soft-tissue]
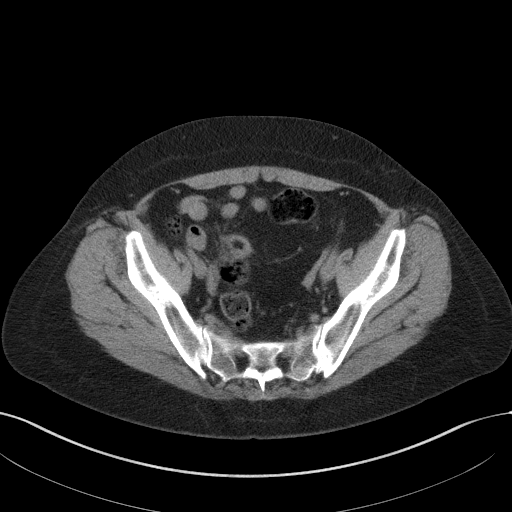
[im 29/89  soft-tissue]
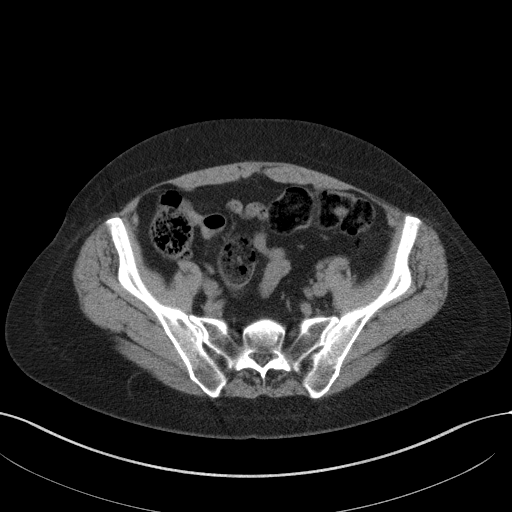
[im 36/89  soft-tissue]
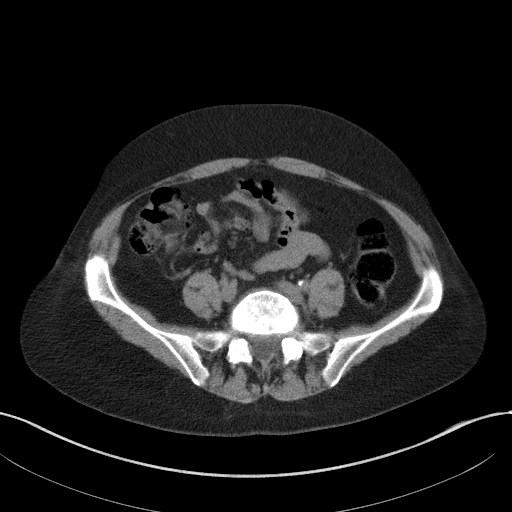
[im 43/89  soft-tissue]
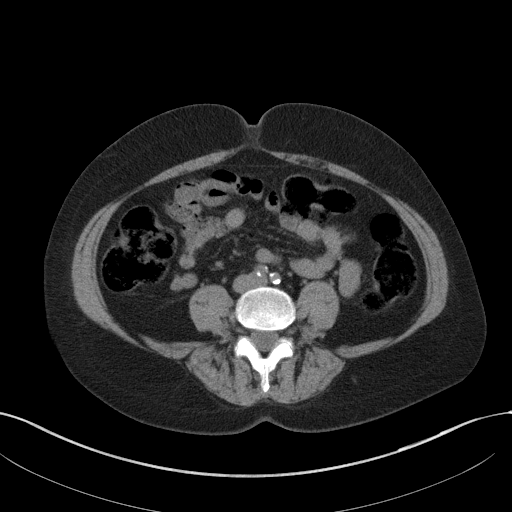
[im 46/89  soft-tissue]
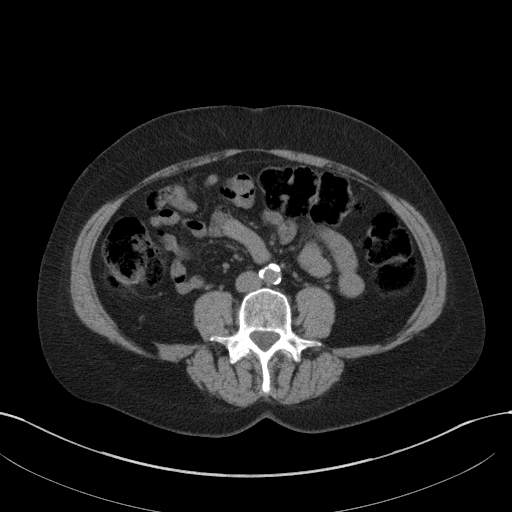
[im 53/89  soft-tissue]
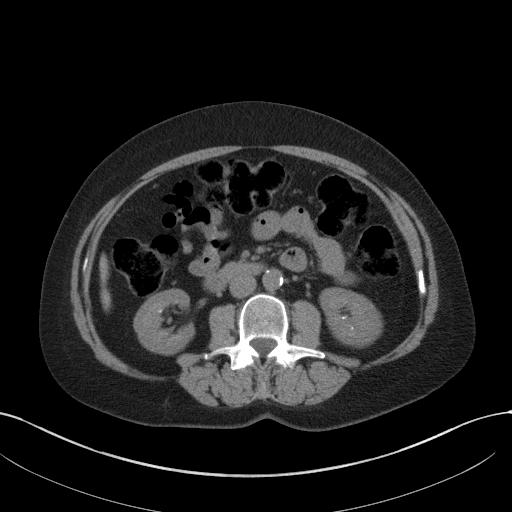
[im 53/89  bone]
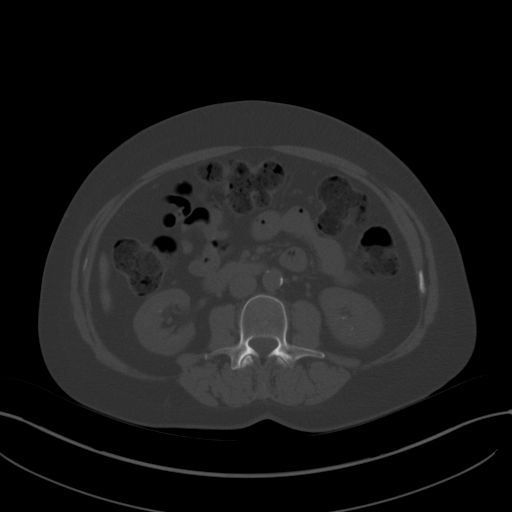
[im 60/89  soft-tissue]
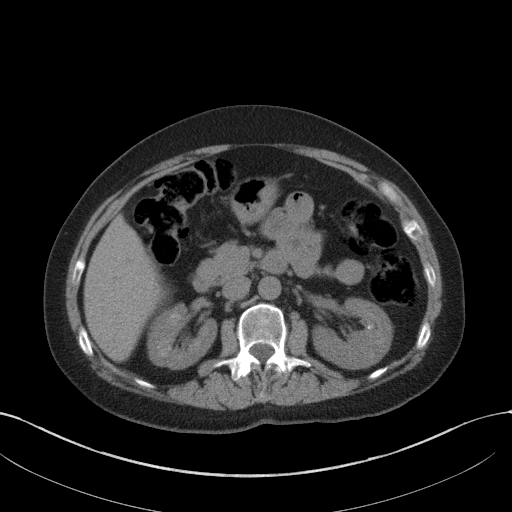
[im 67/89  soft-tissue]
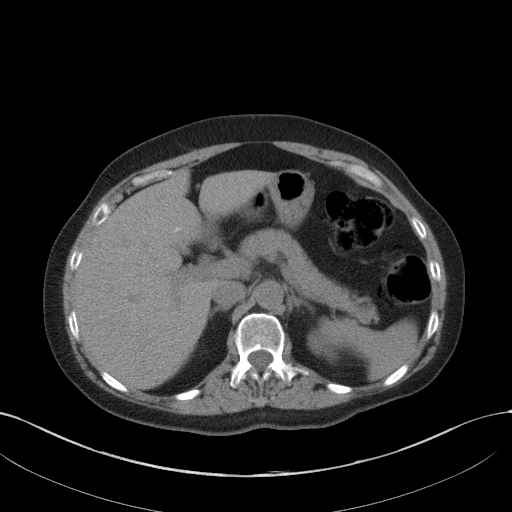
[im 71/89  soft-tissue]
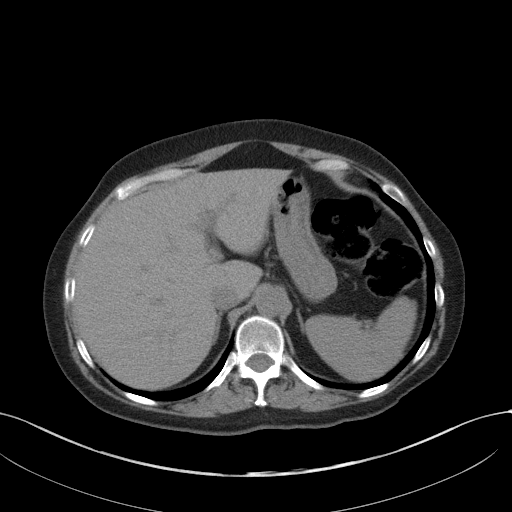
[im 78/89  soft-tissue]
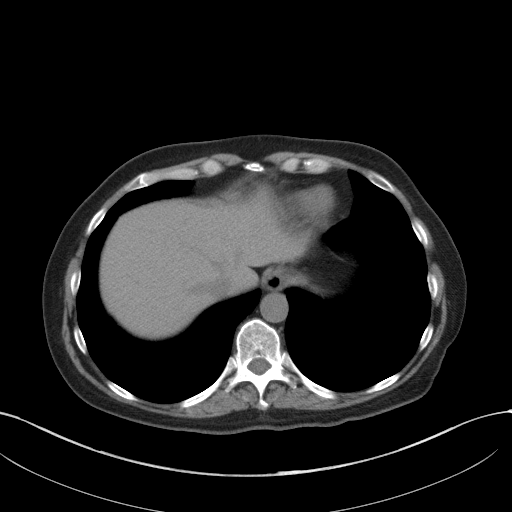
[im 85/89  soft-tissue]
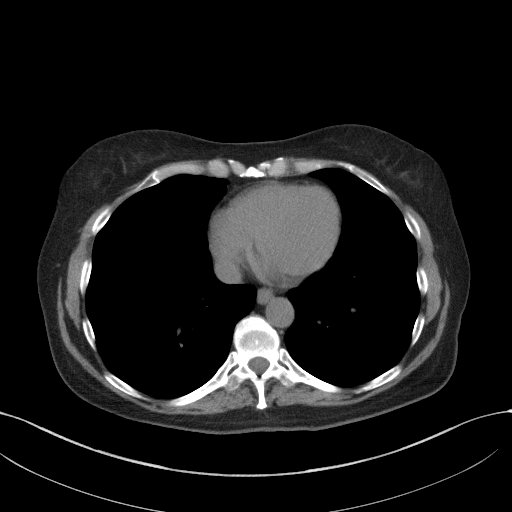

[Series 5: renal stone 3.0 coronal · coronal · 0.75mm/px · 3 of 82 slices shown]
[im 28/82  soft-tissue]
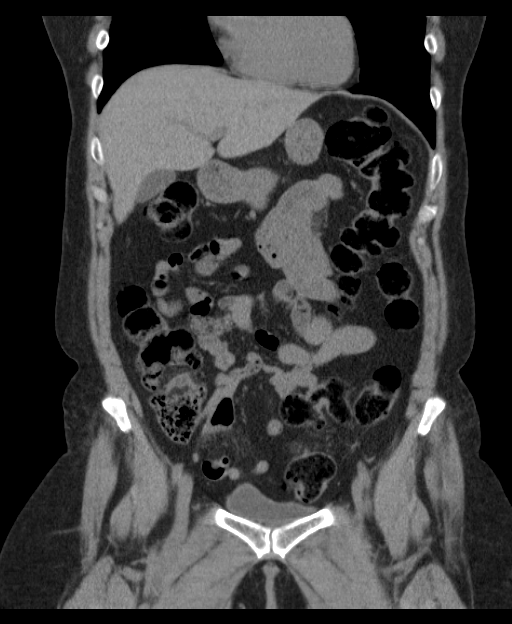
[im 37/82  soft-tissue]
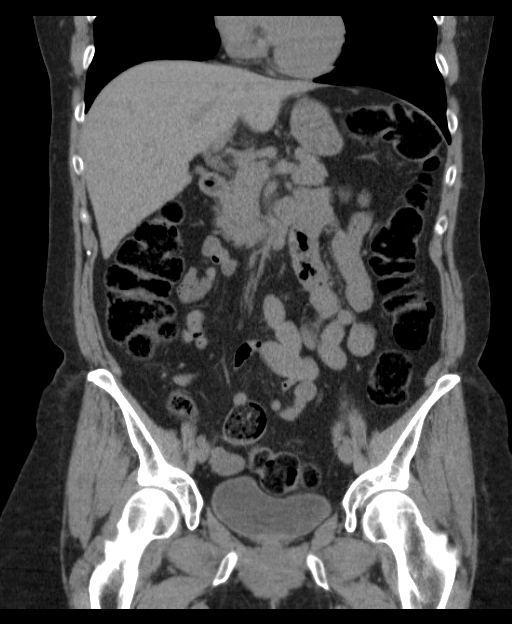
[im 46/82  soft-tissue]
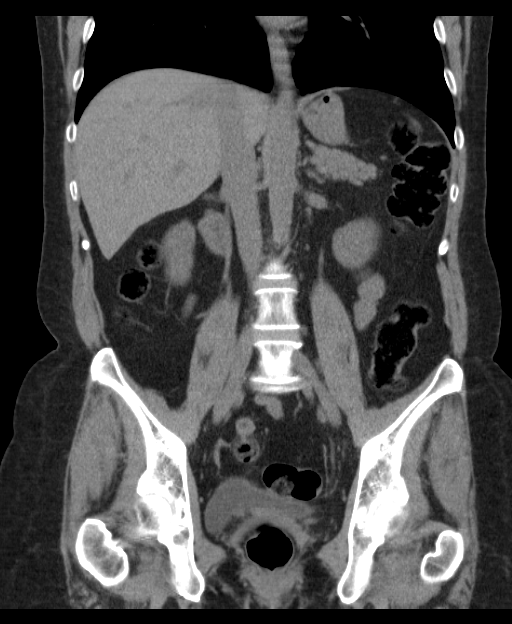

[17 of 46 positions shown; findings below may reference images not displayed]

FINDINGS: Lung bases clear.

Multiple small BILATERAL nonobstructing renal calculi.

No hydronephrosis, ureteral dilatation or ureteral calcification.

Unremarkable bladder.

Liver, spleen, pancreas, and adrenal glands unremarkable for
technique.

Tiny umbilical hernia containing fat.

Uterus surgically absent with normal sized ovaries and
nonvisualization of appendix.

Stomach and bowel loops unremarkable for technique.

Scattered atherosclerotic calcifications.

No mass, adenopathy, free fluid, free air, or acute osseous
findings.
IMPRESSION: BILATERAL small nonobstructing renal calculi.

Tiny umbilical hernia containing fat.

No acute intra-abdominal or intrapelvic abnormalities.

## 2019-08-06 ENCOUNTER — Ambulatory Visit: Payer: 59 | Attending: Internal Medicine

## 2019-08-06 DIAGNOSIS — Z23 Encounter for immunization: Secondary | ICD-10-CM

## 2019-08-06 NOTE — Progress Notes (Signed)
   Covid-19 Vaccination Clinic  Name:  Caroline [name]    MRN: [id number] DOB: [birth year]  08/06/2019  Caroline [name] was observed post Covid-19 immunization for 15 minutes without incidence. She was provided with Vaccine Information Sheet and instruction to access the V-Safe system.   Caroline [name] was instructed to call 911 with any severe reactions post vaccine: Marland Kitchen Difficulty breathing  . Swelling of your face and throat  . A fast heartbeat  . A bad rash all over your body  . Dizziness and weakness    Immunizations Administered    Name Date Dose VIS Date Route   Pfizer COVID-19 Vaccine 08/06/2019  2:25 PM 0.3 mL 06/25/2019 Intramuscular   Manufacturer: [employer], [employer]   Lot: [id number]   NDC: [address]-4

## 2019-08-27 ENCOUNTER — Ambulatory Visit: Payer: 59 | Attending: Internal Medicine

## 2019-08-27 DIAGNOSIS — Z23 Encounter for immunization: Secondary | ICD-10-CM | POA: Insufficient documentation

## 2019-08-27 NOTE — Progress Notes (Signed)
   [facility] Vaccination [facility]  Name:  Caroline [name]    MRN: [id number] DOB: 10-24-1960  08/27/2019  [name] was observed post Covid-19 immunization for 15 minutes without incidence. She was provided with Vaccine Information Sheet and instruction to access the V-Safe system.   [name] was instructed to call 911 with any severe reactions post vaccine: Marland Kitchen Difficulty breathing  . Swelling of your face and throat  . A fast heartbeat  . A bad rash all over your body  . Dizziness and weakness    Immunizations Administered    Name Date Dose VIS Date Route   Pfizer COVID-19 Vaccine 08/27/2019  9:21 AM 0.3 mL 06/25/2019 Intramuscular   Manufacturer: [employer], [employer]   Lot: [id number]   NDC: [id number]
